# Patient Record
Sex: Female | Born: 1970 | Race: White | Hispanic: No | Marital: Married | Smoking: Never smoker
Health system: Southern US, Community
[De-identification: ages and names within clinical notes are randomized; demographics above are authoritative.]

## PROBLEM LIST (undated history)

## (undated) DIAGNOSIS — I10 Essential (primary) hypertension: Secondary | ICD-10-CM

## (undated) DIAGNOSIS — F32A Depression, unspecified: Secondary | ICD-10-CM

## (undated) DIAGNOSIS — M1990 Unspecified osteoarthritis, unspecified site: Secondary | ICD-10-CM

## (undated) DIAGNOSIS — C801 Malignant (primary) neoplasm, unspecified: Secondary | ICD-10-CM

## (undated) DIAGNOSIS — E079 Disorder of thyroid, unspecified: Secondary | ICD-10-CM

## (undated) HISTORY — DX: Depression, unspecified: F32.A

## (undated) HISTORY — DX: Essential (primary) hypertension: I10

## (undated) HISTORY — DX: Disorder of thyroid, unspecified: E07.9

## (undated) HISTORY — DX: Malignant (primary) neoplasm, unspecified: C80.1

## (undated) HISTORY — DX: Unspecified osteoarthritis, unspecified site: M19.90

---

## 2021-06-30 DIAGNOSIS — F5101 Primary insomnia: Secondary | ICD-10-CM | POA: Diagnosis not present

## 2021-06-30 DIAGNOSIS — I10 Essential (primary) hypertension: Secondary | ICD-10-CM | POA: Diagnosis not present

## 2021-06-30 DIAGNOSIS — E669 Obesity, unspecified: Secondary | ICD-10-CM | POA: Diagnosis not present

## 2021-06-30 DIAGNOSIS — E039 Hypothyroidism, unspecified: Secondary | ICD-10-CM | POA: Diagnosis not present

## 2021-06-30 DIAGNOSIS — Z833 Family history of diabetes mellitus: Secondary | ICD-10-CM | POA: Diagnosis not present

## 2021-06-30 DIAGNOSIS — E7849 Other hyperlipidemia: Secondary | ICD-10-CM | POA: Diagnosis not present

## 2021-06-30 DIAGNOSIS — F418 Other specified anxiety disorders: Secondary | ICD-10-CM | POA: Diagnosis not present

## 2021-07-23 DIAGNOSIS — J029 Acute pharyngitis, unspecified: Secondary | ICD-10-CM | POA: Diagnosis not present

## 2021-07-23 DIAGNOSIS — R109 Unspecified abdominal pain: Secondary | ICD-10-CM | POA: Diagnosis not present

## 2021-07-23 DIAGNOSIS — Z20828 Contact with and (suspected) exposure to other viral communicable diseases: Secondary | ICD-10-CM | POA: Diagnosis not present

## 2021-10-16 DIAGNOSIS — F418 Other specified anxiety disorders: Secondary | ICD-10-CM | POA: Diagnosis not present

## 2021-10-16 DIAGNOSIS — E039 Hypothyroidism, unspecified: Secondary | ICD-10-CM | POA: Diagnosis not present

## 2021-10-16 DIAGNOSIS — I10 Essential (primary) hypertension: Secondary | ICD-10-CM | POA: Diagnosis not present

## 2021-10-16 DIAGNOSIS — N809 Endometriosis, unspecified: Secondary | ICD-10-CM | POA: Diagnosis not present

## 2022-01-24 DIAGNOSIS — Z1389 Encounter for screening for other disorder: Secondary | ICD-10-CM | POA: Diagnosis not present

## 2022-01-24 DIAGNOSIS — G4700 Insomnia, unspecified: Secondary | ICD-10-CM | POA: Diagnosis not present

## 2022-01-24 DIAGNOSIS — Z01419 Encounter for gynecological examination (general) (routine) without abnormal findings: Secondary | ICD-10-CM | POA: Diagnosis not present

## 2022-01-24 DIAGNOSIS — Z130 Encounter for screening for diseases of the blood and blood-forming organs and certain disorders involving the immune mechanism: Secondary | ICD-10-CM | POA: Diagnosis not present

## 2022-02-14 DIAGNOSIS — Z1231 Encounter for screening mammogram for malignant neoplasm of breast: Secondary | ICD-10-CM | POA: Diagnosis not present

## 2022-02-14 DIAGNOSIS — R102 Pelvic and perineal pain: Secondary | ICD-10-CM | POA: Diagnosis not present

## 2022-03-19 ENCOUNTER — Ambulatory Visit (INDEPENDENT_AMBULATORY_CARE_PROVIDER_SITE_OTHER): Payer: BC Managed Care – PPO

## 2022-03-19 ENCOUNTER — Other Ambulatory Visit: Payer: Self-pay

## 2022-03-19 ENCOUNTER — Ambulatory Visit (INDEPENDENT_AMBULATORY_CARE_PROVIDER_SITE_OTHER): Payer: BC Managed Care – PPO | Admitting: Internal Medicine

## 2022-03-19 ENCOUNTER — Encounter: Payer: Self-pay | Admitting: Internal Medicine

## 2022-03-19 VITALS — BP 156/96 | HR 95 | Temp 99.3°F | Ht 64.0 in | Wt 190.0 lb

## 2022-03-19 DIAGNOSIS — G8929 Other chronic pain: Secondary | ICD-10-CM | POA: Diagnosis not present

## 2022-03-19 DIAGNOSIS — K5904 Chronic idiopathic constipation: Secondary | ICD-10-CM | POA: Insufficient documentation

## 2022-03-19 DIAGNOSIS — E039 Hypothyroidism, unspecified: Secondary | ICD-10-CM

## 2022-03-19 DIAGNOSIS — M545 Low back pain, unspecified: Secondary | ICD-10-CM | POA: Diagnosis not present

## 2022-03-19 DIAGNOSIS — E785 Hyperlipidemia, unspecified: Secondary | ICD-10-CM

## 2022-03-19 DIAGNOSIS — F332 Major depressive disorder, recurrent severe without psychotic features: Secondary | ICD-10-CM

## 2022-03-19 DIAGNOSIS — F5104 Psychophysiologic insomnia: Secondary | ICD-10-CM

## 2022-03-19 DIAGNOSIS — I10 Essential (primary) hypertension: Secondary | ICD-10-CM | POA: Diagnosis not present

## 2022-03-19 LAB — URINALYSIS, ROUTINE W REFLEX MICROSCOPIC
Bilirubin Urine: NEGATIVE
Ketones, ur: NEGATIVE
Nitrite: NEGATIVE
Specific Gravity, Urine: >=1.03 — AB (ref 1.000–1.030)
Total Protein, Urine: NEGATIVE
Urine Glucose: NEGATIVE
Urobilinogen, UA: 0.2 (ref 0.0–1.0)
pH: 5.5 (ref 5.0–8.0)

## 2022-03-19 LAB — CBC WITH DIFFERENTIAL/PLATELET
Basophils Absolute: 0.1 10*3/uL (ref 0.0–0.1)
Basophils Relative: 0.7 % (ref 0.0–3.0)
Eosinophils Absolute: 0.1 10*3/uL (ref 0.0–0.7)
Eosinophils Relative: 1.5 % (ref 0.0–5.0)
HCT: 39.5 % (ref 36.0–46.0)
Hemoglobin: 13.5 g/dL (ref 12.0–15.0)
Lymphocytes Relative: 23 % (ref 12.0–46.0)
Lymphs Abs: 2.1 10*3/uL (ref 0.7–4.0)
MCHC: 34.1 g/dL (ref 30.0–36.0)
MCV: 89.5 fl (ref 78.0–100.0)
Monocytes Absolute: 0.5 10*3/uL (ref 0.1–1.0)
Monocytes Relative: 5.9 % (ref 3.0–12.0)
Neutro Abs: 6.3 10*3/uL (ref 1.4–7.7)
Neutrophils Relative %: 68.9 % (ref 43.0–77.0)
Platelets: 349 10*3/uL (ref 150.0–400.0)
RBC: 4.42 Mil/uL (ref 3.87–5.11)
RDW: 13.6 % (ref 11.5–15.5)
WBC: 9.1 10*3/uL (ref 4.0–10.5)

## 2022-03-19 LAB — TSH: TSH: 0.91 u[IU]/mL (ref 0.35–5.50)

## 2022-03-19 NOTE — Patient Instructions (Signed)

## 2022-03-19 NOTE — Progress Notes (Signed)
? ?Subjective:  ?Patient ID: Jennifer Jensen, female    DOB: 12-21-1970  Age: 52 y.o. MRN: [id number] ? ?CC: Hypertension, Osteoarthritis, and Back Pain ? ?This visit occurred during the SARS-CoV-2 public health emergency.  Safety protocols were in place, including screening questions prior to the visit, additional usage of staff PPE, and extensive cleaning of exam room while observing appropriate contact time as indicated for disinfecting solutions.   ? ?HPI ?Jennifer Jensen presents for establishing. ? ?She complains of a several month hx of DOE but denies CP, edema, diaphoresis, or palpitations. Her M died 3 months ago and she has had worsening s/s of depression since then but denies feeling worthless, helpless, hopeless, or suicidal.  She is not getting much symptom relief with her current doses of Lexapro and Ambien. ? ?History ?Jennifer Jensen has a past medical history of Arthritis, Cancer ([place]), Depression, Hypertension, and Thyroid disease.  ? ?She has no past surgical history on file.  ? ?Her family history includes COPD in her father; Cancer in her mother; Depression in her brother; Diabetes in her brother and mother; Heart attack in her father; Hyperlipidemia in her brother and sister; Hypertension in her brother, mother, and sister.She reports that she has never smoked. She has been exposed to tobacco smoke. She has never used smokeless tobacco. She reports current alcohol use of about 1.0 standard drink per week. She reports that she does not use drugs. ? ?Outpatient Medications Prior to Visit  ?Medication Sig Dispense Refill  ? ASHLYNA 0.15-0.03 &0.01 MG tablet Take 1 tablet by mouth daily.    ? levothyroxine (SYNTHROID) 50 MCG tablet Take 50 mcg by mouth daily.    ? lisinopril (ZESTRIL) 10 MG tablet Take 10 mg by mouth daily.    ? Multiple Vitamins-Minerals (WOMENS MULTI) CAPS See admin instructions.    ? escitalopram (LEXAPRO) 10 MG tablet 1 tablet    ? ?No facility-administered medications prior to visit.   ? ? ?ROS ?Review of Systems  ?Constitutional:  Positive for unexpected weight change (wt gain). Negative for appetite change, chills, diaphoresis and fatigue.  ?Respiratory:  Positive for apnea. Negative for shortness of breath and wheezing.   ?Cardiovascular:  Negative for chest pain, palpitations and leg swelling.  ?Gastrointestinal:  Positive for constipation. Negative for abdominal pain, blood in stool, diarrhea and vomiting.  ?Genitourinary: Negative.  Negative for difficulty urinating, dysuria and hematuria.  ?Musculoskeletal:  Positive for arthralgias (hips and knees) and back pain. Negative for gait problem and myalgias.  ?Skin: Negative.  Negative for color change.  ?Neurological: Negative.  Negative for dizziness, weakness and headaches.  ?Hematological: Negative.   ?Psychiatric/Behavioral:  Positive for dysphoric mood and sleep disturbance. Negative for agitation, behavioral problems, confusion, decreased concentration, hallucinations, self-injury and suicidal ideas. The patient is not nervous/anxious and is not hyperactive.   ?     Crying spells  ? ?Objective:  ?BP (!) 156/96   Pulse 95   Temp 99.3 ?F (37.4 ?C) (Oral)   Ht '5\' 4"'$  (1.626 m)   Wt 190 lb (86.2 kg)   LMP 12/14/2021 (Approximate)   SpO2 98%   BMI 32.61 kg/m?  ? ?Physical Exam ?Cardiovascular:  ?   Rate and Rhythm: Normal rate and regular rhythm.  ?   Heart sounds: Normal heart sounds, S1 normal and S2 normal. No murmur heard. ?  No friction rub. No gallop.  ?   Comments: EKG- ?NSR, 80 bpm ??LAE ?No LVH or Q waves ?Musculoskeletal:  ?  Right lower leg: No edema.  ?   Left lower leg: No edema.  ?Psychiatric:     ?   Attention and Perception: She is inattentive.     ?   Mood and Affect: Mood is anxious and depressed. Affect is tearful.     ?   Speech: She is communicative. Speech is tangential. Speech is not rapid and pressured or delayed.     ?   Behavior: Behavior normal. Behavior is not agitated, slowed, aggressive, withdrawn or  hyperactive. Behavior is cooperative.     ?   Thought Content: Thought content normal. Thought content is not paranoid or delusional. Thought content does not include homicidal or suicidal ideation.     ?   Cognition and Memory: Cognition normal.     ?   Judgment: Judgment normal.  ? ? ?Lab Results  ?Component Value Date  ? WBC 9.1 03/19/2022  ? HGB 13.5 03/19/2022  ? HCT 39.5 03/19/2022  ? PLT 349.0 03/19/2022  ? GLUCOSE 90 03/19/2022  ? CHOL 201 (H) 03/19/2022  ? TRIG 214.0 (H) 03/19/2022  ? HDL 48.40 03/19/2022  ? LDLDIRECT 149.0 03/19/2022  ? ALT 15 03/19/2022  ? AST 18 03/19/2022  ? NA 138 03/19/2022  ? K 4.9 03/19/2022  ? CL 107 03/19/2022  ? CREATININE 0.80 03/19/2022  ? BUN 13 03/19/2022  ? CO2 21 03/19/2022  ? TSH 0.91 03/19/2022  ?  ?DG Lumbar Spine Complete ? ?Result Date: 03/22/2022 ?CLINICAL DATA:  Low back pain for 2-3 months. EXAM: LUMBAR SPINE - COMPLETE 4+ VIEW COMPARISON:  None. FINDINGS: No fracture, bone lesion or spondylolisthesis. There are transitional thoracolumbar and lumbosacral vertebra. Lowest vertebra consistent with lumbarization of S1, designated S1. Mild loss of disc height at L4-L5 and L5-S1. Mild facet degenerative changes at L5-S1. Soft tissues are unremarkable. IMPRESSION: 1. No fracture or acute finding. 2. Mild lower lumbar spine disc and facet degenerative changes as detailed. Electronically Signed   By: Lajean Manes M.D.   On: 03/22/2022 10:10    ? ?Assessment & Plan:  ? ?Jennifer Jensen was seen today for hypertension, osteoarthritis and back pain. ? ?Diagnoses and all orders for this visit: ? ?Hyperlipidemia LDL goal <130- Statin therapy is not indicated. ?-     Lipid panel; Future ?-     TSH; Future ?-     Hepatic function panel; Future ?-     Hepatic function panel ?-     TSH ?-     Lipid panel ? ?Chronic idiopathic constipation- Labs are negative for secondary causes. ?-     Basic metabolic panel; Future ?-     Basic metabolic panel ? ?Chronic bilateral low back pain without  sciatica- Plain films are remarkable for degenerative disc disease. ?-     DG Lumbar Spine Complete; Future ? ?Primary hypertension- Her EKG is normal.  Will check labs to screen for secondary causes and endorgan damage.  Will treat with indapamide. ?-     Basic metabolic panel; Future ?-     Urinalysis, Routine w reflex microscopic; Future ?-     Hepatic function panel; Future ?-     CBC with Differential/Platelet; Future ?-     Aldosterone + renin activity w/ ratio; Future ?-     EKG 12-Lead ?-     Aldosterone + renin activity w/ ratio ?-     CBC with Differential/Platelet ?-     Hepatic function panel ?-     Urinalysis, Routine w  reflex microscopic ?-     Basic metabolic panel ?-     indapamide (LOZOL) 1.25 MG tablet; Take 1 tablet (1.25 mg total) by mouth daily. ? ?Acquired hypothyroidism- Her TSH is in the normal range.  She will stay on the current T4 dosage. ? ?Severe episode of recurrent major depressive disorder, without psychotic features ([place]) ?-     Vilazodone HCl (VIIBRYD) 10 MG TABS; Take 1 tablet (10 mg total) by mouth daily. ? ?Psychophysiological insomnia ?-     Suvorexant (BELSOMRA) 15 MG TABS; Take 15 mg by mouth at bedtime as needed. ? ?Other orders ?-     LDL cholesterol, direct ? ? ?I have discontinued Jennifer Jensen. Schildt's escitalopram. I am also having her start on indapamide, Vilazodone HCl, and Belsomra. Additionally, I am having her maintain her Ashlyna, levothyroxine, lisinopril, and [employer]. ? ?Meds ordered this encounter  ?Medications  ? indapamide (LOZOL) 1.25 MG tablet  ?  Sig: Take 1 tablet (1.25 mg total) by mouth daily.  ?  Dispense:  90 tablet  ?  Refill:  0  ? Vilazodone HCl (VIIBRYD) 10 MG TABS  ?  Sig: Take 1 tablet (10 mg total) by mouth daily.  ?  Dispense:  7 tablet  ?  Refill:  0  ? Suvorexant (BELSOMRA) 15 MG TABS  ?  Sig: Take 15 mg by mouth at bedtime as needed.  ?  Dispense:  90 tablet  ?  Refill:  0  ? ? ? ?Follow-up: Return in about 6 weeks (around 04/30/2022). ? ?Scarlette Calico, MD ?

## 2022-03-20 ENCOUNTER — Encounter: Payer: Self-pay | Admitting: Internal Medicine

## 2022-03-20 DIAGNOSIS — F5104 Psychophysiologic insomnia: Secondary | ICD-10-CM | POA: Insufficient documentation

## 2022-03-20 DIAGNOSIS — F332 Major depressive disorder, recurrent severe without psychotic features: Secondary | ICD-10-CM | POA: Insufficient documentation

## 2022-03-20 LAB — BASIC METABOLIC PANEL
BUN: 13 mg/dL (ref 6–23)
CO2: 21 mEq/L (ref 19–32)
Calcium: 9.2 mg/dL (ref 8.4–10.5)
Chloride: 107 mEq/L (ref 96–112)
Creatinine, Ser: 0.8 mg/dL (ref 0.40–1.20)
GFR: 86.16 mL/min (ref >60.00)
Glucose, Bld: 90 mg/dL (ref 70–99)
Potassium: 4.9 mEq/L (ref 3.5–5.1)
Sodium: 138 mEq/L (ref 135–145)

## 2022-03-20 LAB — LIPID PANEL
Cholesterol: 201 mg/dL — ABNORMAL HIGH (ref 0–200)
HDL: 48.4 mg/dL (ref >39.00)
NonHDL: 152.8
Total CHOL/HDL Ratio: 4
Triglycerides: 214 mg/dL — ABNORMAL HIGH (ref 0.0–149.0)
VLDL: 42.8 mg/dL — ABNORMAL HIGH (ref 0.0–40.0)

## 2022-03-20 LAB — HEPATIC FUNCTION PANEL
ALT: 15 U/L (ref 0–35)
AST: 18 U/L (ref 0–37)
Albumin: 4.1 g/dL (ref 3.5–5.2)
Alkaline Phosphatase: 54 U/L (ref 39–117)
Bilirubin, Direct: 0 mg/dL (ref 0.0–0.3)
Total Bilirubin: 0.2 mg/dL (ref 0.2–1.2)
Total Protein: 7 g/dL (ref 6.0–8.3)

## 2022-03-20 LAB — LDL CHOLESTEROL, DIRECT: Direct LDL: 149 mg/dL

## 2022-03-20 MED ORDER — BELSOMRA 15 MG PO TABS: 15.0000 mg | ORAL_TABLET | Freq: Every evening | ORAL | 0 refills | Status: DC | PRN

## 2022-03-20 MED ORDER — INDAPAMIDE 1.25 MG PO TABS: 1.2500 mg | ORAL_TABLET | Freq: Every day | ORAL | 0 refills | Status: DC

## 2022-03-20 MED ORDER — VILAZODONE HCL 10 MG PO TABS: 10.0000 mg | ORAL_TABLET | Freq: Every day | ORAL | 0 refills | Status: DC

## 2022-03-23 ENCOUNTER — Encounter: Payer: Self-pay | Admitting: Internal Medicine

## 2022-03-23 ENCOUNTER — Other Ambulatory Visit: Payer: Self-pay | Admitting: Internal Medicine

## 2022-03-23 DIAGNOSIS — M545 Low back pain, unspecified: Secondary | ICD-10-CM

## 2022-03-23 MED ORDER — CELECOXIB 50 MG PO CAPS: 50.0000 mg | ORAL_CAPSULE | Freq: Two times a day (BID) | ORAL | 0 refills | Status: DC

## 2022-03-24 ENCOUNTER — Other Ambulatory Visit: Payer: Self-pay | Admitting: Internal Medicine

## 2022-03-24 ENCOUNTER — Encounter: Payer: Self-pay | Admitting: Internal Medicine

## 2022-03-24 DIAGNOSIS — G8929 Other chronic pain: Secondary | ICD-10-CM

## 2022-03-24 MED ORDER — CELECOXIB 100 MG PO CAPS: 100.0000 mg | ORAL_CAPSULE | Freq: Every day | ORAL | 1 refills | Status: AC

## 2022-03-26 ENCOUNTER — Other Ambulatory Visit: Payer: Self-pay | Admitting: Internal Medicine

## 2022-03-26 DIAGNOSIS — M545 Low back pain, unspecified: Secondary | ICD-10-CM

## 2022-03-26 DIAGNOSIS — F5104 Psychophysiologic insomnia: Secondary | ICD-10-CM

## 2022-03-26 LAB — ALDOSTERONE + RENIN ACTIVITY W/ RATIO
ALDO / PRA Ratio: 1 Ratio (ref 0.9–28.9)
Aldosterone: 5 ng/dL
Renin Activity: 5.12 ng/mL/h (ref 0.25–5.82)

## 2022-03-26 MED ORDER — ZOLPIDEM TARTRATE ER 12.5 MG PO TBCR: 12.5000 mg | EXTENDED_RELEASE_TABLET | Freq: Every evening | ORAL | 0 refills | Status: AC | PRN

## 2022-03-28 ENCOUNTER — Other Ambulatory Visit: Payer: Self-pay | Admitting: Internal Medicine

## 2022-03-28 DIAGNOSIS — I10 Essential (primary) hypertension: Secondary | ICD-10-CM

## 2022-03-28 DIAGNOSIS — F332 Major depressive disorder, recurrent severe without psychotic features: Secondary | ICD-10-CM

## 2022-03-28 DIAGNOSIS — Z3041 Encounter for surveillance of contraceptive pills: Secondary | ICD-10-CM

## 2022-03-28 MED ORDER — ASHLYNA 0.15-0.03 &0.01 MG PO TABS: 1.0000 | ORAL_TABLET | Freq: Every day | ORAL | 1 refills | Status: AC

## 2022-03-28 MED ORDER — OLMESARTAN MEDOXOMIL 20 MG PO TABS: 20.0000 mg | ORAL_TABLET | Freq: Every day | ORAL | 1 refills | Status: AC

## 2022-03-28 MED ORDER — VILAZODONE HCL 20 MG PO TABS: 1.0000 | ORAL_TABLET | Freq: Every day | ORAL | 0 refills | Status: DC

## 2022-04-07 ENCOUNTER — Encounter: Payer: Self-pay | Admitting: Internal Medicine

## 2022-04-09 ENCOUNTER — Other Ambulatory Visit: Payer: Self-pay | Admitting: Internal Medicine

## 2022-04-09 DIAGNOSIS — F332 Major depressive disorder, recurrent severe without psychotic features: Secondary | ICD-10-CM

## 2022-04-09 MED ORDER — ESCITALOPRAM OXALATE 20 MG PO TABS: 20.0000 mg | ORAL_TABLET | Freq: Every day | ORAL | 1 refills | Status: DC

## 2022-04-25 DIAGNOSIS — L600 Ingrowing nail: Secondary | ICD-10-CM | POA: Diagnosis not present

## 2022-04-25 DIAGNOSIS — M79672 Pain in left foot: Secondary | ICD-10-CM | POA: Diagnosis not present

## 2022-04-25 DIAGNOSIS — M79671 Pain in right foot: Secondary | ICD-10-CM | POA: Diagnosis not present

## 2022-04-30 DIAGNOSIS — L600 Ingrowing nail: Secondary | ICD-10-CM | POA: Diagnosis not present

## 2022-04-30 DIAGNOSIS — M79672 Pain in left foot: Secondary | ICD-10-CM | POA: Diagnosis not present

## 2022-04-30 DIAGNOSIS — M79671 Pain in right foot: Secondary | ICD-10-CM | POA: Diagnosis not present

## 2022-05-01 ENCOUNTER — Ambulatory Visit: Payer: BC Managed Care – PPO | Admitting: Internal Medicine

## 2022-05-15 DIAGNOSIS — Z3041 Encounter for surveillance of contraceptive pills: Secondary | ICD-10-CM | POA: Diagnosis not present

## 2022-05-15 DIAGNOSIS — Z8742 Personal history of other diseases of the female genital tract: Secondary | ICD-10-CM | POA: Diagnosis not present

## 2022-05-15 DIAGNOSIS — M545 Low back pain, unspecified: Secondary | ICD-10-CM | POA: Diagnosis not present

## 2022-06-13 DIAGNOSIS — F339 Major depressive disorder, recurrent, unspecified: Secondary | ICD-10-CM | POA: Diagnosis not present

## 2022-06-13 DIAGNOSIS — G4733 Obstructive sleep apnea (adult) (pediatric): Secondary | ICD-10-CM | POA: Diagnosis not present

## 2022-06-13 DIAGNOSIS — Z85831 Personal history of malignant neoplasm of soft tissue: Secondary | ICD-10-CM | POA: Diagnosis not present

## 2022-06-13 DIAGNOSIS — Z1211 Encounter for screening for malignant neoplasm of colon: Secondary | ICD-10-CM | POA: Diagnosis not present

## 2022-06-13 DIAGNOSIS — K6389 Other specified diseases of intestine: Secondary | ICD-10-CM | POA: Diagnosis not present

## 2022-06-13 DIAGNOSIS — E785 Hyperlipidemia, unspecified: Secondary | ICD-10-CM | POA: Diagnosis not present

## 2022-06-13 DIAGNOSIS — M1990 Unspecified osteoarthritis, unspecified site: Secondary | ICD-10-CM | POA: Diagnosis not present

## 2022-06-13 DIAGNOSIS — Z8371 Family history of colonic polyps: Secondary | ICD-10-CM | POA: Diagnosis not present

## 2022-06-13 DIAGNOSIS — Q439 Congenital malformation of intestine, unspecified: Secondary | ICD-10-CM | POA: Diagnosis not present

## 2022-06-13 DIAGNOSIS — E039 Hypothyroidism, unspecified: Secondary | ICD-10-CM | POA: Diagnosis not present

## 2022-06-13 DIAGNOSIS — Z79899 Other long term (current) drug therapy: Secondary | ICD-10-CM | POA: Diagnosis not present

## 2022-06-13 DIAGNOSIS — K529 Noninfective gastroenteritis and colitis, unspecified: Secondary | ICD-10-CM | POA: Diagnosis not present

## 2022-06-13 DIAGNOSIS — Z880 Allergy status to penicillin: Secondary | ICD-10-CM | POA: Diagnosis not present

## 2022-06-13 DIAGNOSIS — Z6830 Body mass index (BMI) 30.0-30.9, adult: Secondary | ICD-10-CM | POA: Diagnosis not present

## 2022-06-13 DIAGNOSIS — I10 Essential (primary) hypertension: Secondary | ICD-10-CM | POA: Diagnosis not present

## 2022-06-13 DIAGNOSIS — Z87891 Personal history of nicotine dependence: Secondary | ICD-10-CM | POA: Diagnosis not present

## 2022-06-13 DIAGNOSIS — Z791 Long term (current) use of non-steroidal anti-inflammatories (NSAID): Secondary | ICD-10-CM | POA: Diagnosis not present

## 2022-06-27 DIAGNOSIS — Z131 Encounter for screening for diabetes mellitus: Secondary | ICD-10-CM | POA: Diagnosis not present

## 2022-06-27 DIAGNOSIS — Z23 Encounter for immunization: Secondary | ICD-10-CM | POA: Diagnosis not present

## 2022-06-27 DIAGNOSIS — I10 Essential (primary) hypertension: Secondary | ICD-10-CM | POA: Diagnosis not present

## 2022-06-27 DIAGNOSIS — E039 Hypothyroidism, unspecified: Secondary | ICD-10-CM | POA: Diagnosis not present

## 2022-06-27 DIAGNOSIS — F325 Major depressive disorder, single episode, in full remission: Secondary | ICD-10-CM | POA: Diagnosis not present

## 2022-06-27 DIAGNOSIS — E785 Hyperlipidemia, unspecified: Secondary | ICD-10-CM | POA: Diagnosis not present

## 2022-06-27 DIAGNOSIS — F5104 Psychophysiologic insomnia: Secondary | ICD-10-CM | POA: Diagnosis not present

## 2022-07-12 DIAGNOSIS — L03032 Cellulitis of left toe: Secondary | ICD-10-CM | POA: Diagnosis not present

## 2022-07-12 DIAGNOSIS — M79672 Pain in left foot: Secondary | ICD-10-CM | POA: Diagnosis not present

## 2022-08-30 DIAGNOSIS — I10 Essential (primary) hypertension: Secondary | ICD-10-CM | POA: Diagnosis not present

## 2022-08-30 DIAGNOSIS — L600 Ingrowing nail: Secondary | ICD-10-CM | POA: Diagnosis not present

## 2022-08-30 DIAGNOSIS — M79674 Pain in right toe(s): Secondary | ICD-10-CM | POA: Diagnosis not present

## 2022-08-30 DIAGNOSIS — M79675 Pain in left toe(s): Secondary | ICD-10-CM | POA: Diagnosis not present

## 2022-10-17 DIAGNOSIS — B9689 Other specified bacterial agents as the cause of diseases classified elsewhere: Secondary | ICD-10-CM | POA: Diagnosis not present

## 2022-10-17 DIAGNOSIS — J019 Acute sinusitis, unspecified: Secondary | ICD-10-CM | POA: Diagnosis not present

## 2022-10-17 DIAGNOSIS — I10 Essential (primary) hypertension: Secondary | ICD-10-CM | POA: Diagnosis not present

## 2022-11-10 ENCOUNTER — Other Ambulatory Visit: Payer: Self-pay | Admitting: Internal Medicine

## 2022-11-10 DIAGNOSIS — F332 Major depressive disorder, recurrent severe without psychotic features: Secondary | ICD-10-CM

## 2022-11-13 DIAGNOSIS — H66002 Acute suppurative otitis media without spontaneous rupture of ear drum, left ear: Secondary | ICD-10-CM | POA: Diagnosis not present

## 2022-11-13 DIAGNOSIS — I10 Essential (primary) hypertension: Secondary | ICD-10-CM | POA: Diagnosis not present

## 2022-11-13 DIAGNOSIS — F332 Major depressive disorder, recurrent severe without psychotic features: Secondary | ICD-10-CM | POA: Diagnosis not present

## 2022-11-13 DIAGNOSIS — J0141 Acute recurrent pansinusitis: Secondary | ICD-10-CM | POA: Diagnosis not present

## 2023-12-12 IMAGING — DX DG LUMBAR SPINE COMPLETE 4+V
5 series · 5 of 5 positions shown · non-contrast
Comparison: None.

CLINICAL DATA: Low back pain for 2-3 months.

EXAM:
LUMBAR SPINE - COMPLETE 4+ VIEW

[l-spine ap]
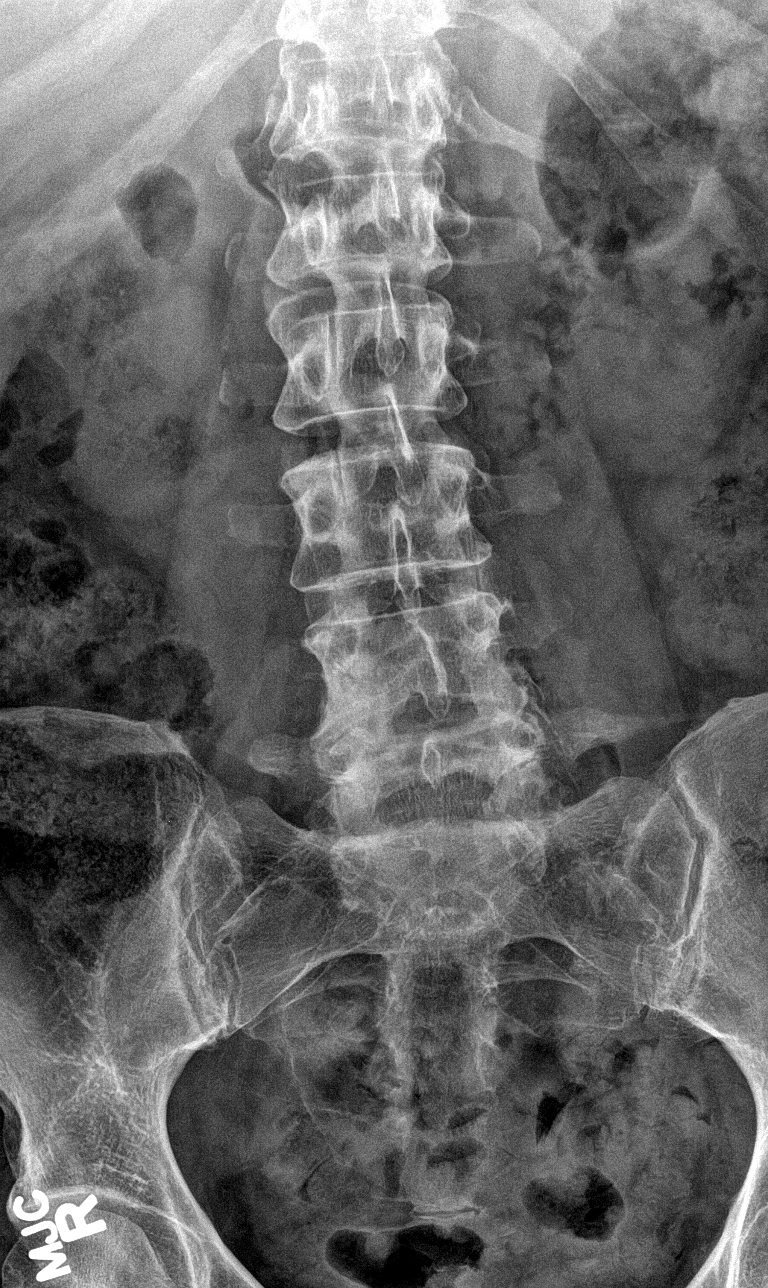

[l-spine obl (1 of 2)]
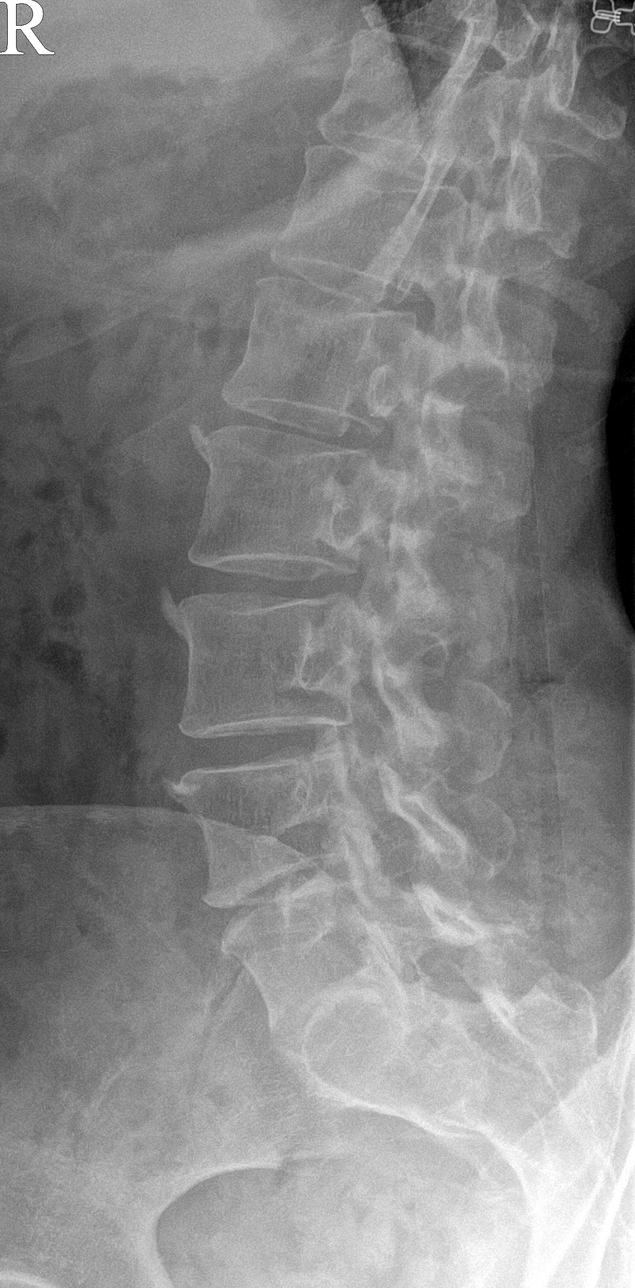

[l-spine obl (2 of 2)]
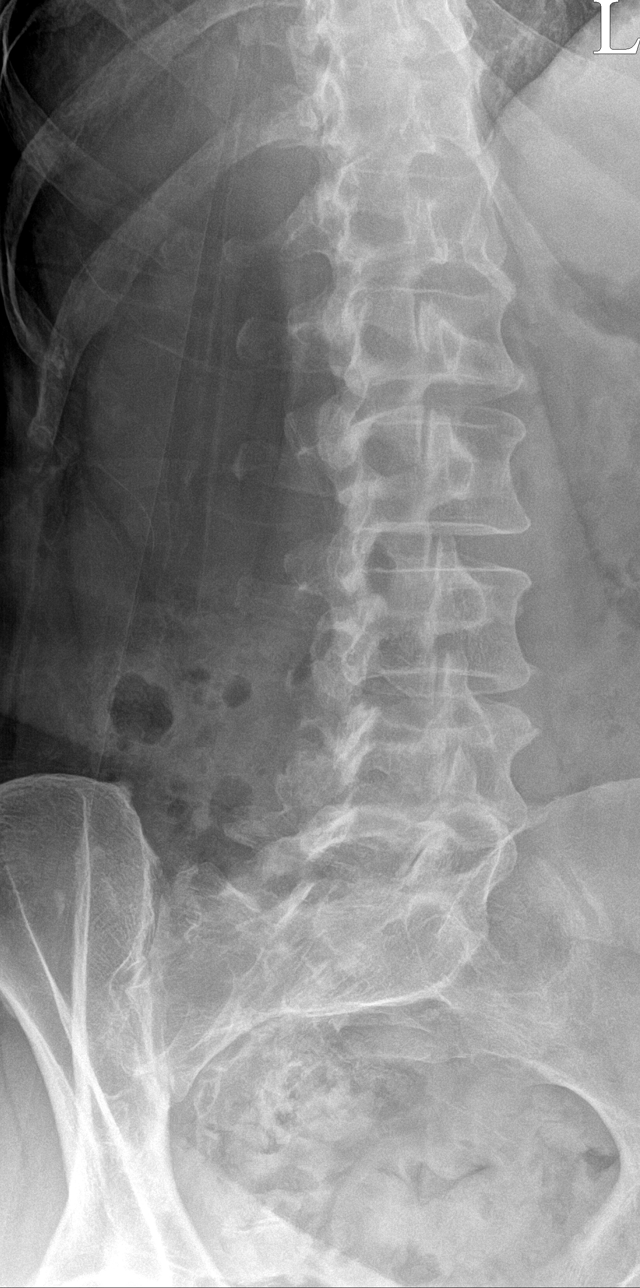

[l-spine lateral]
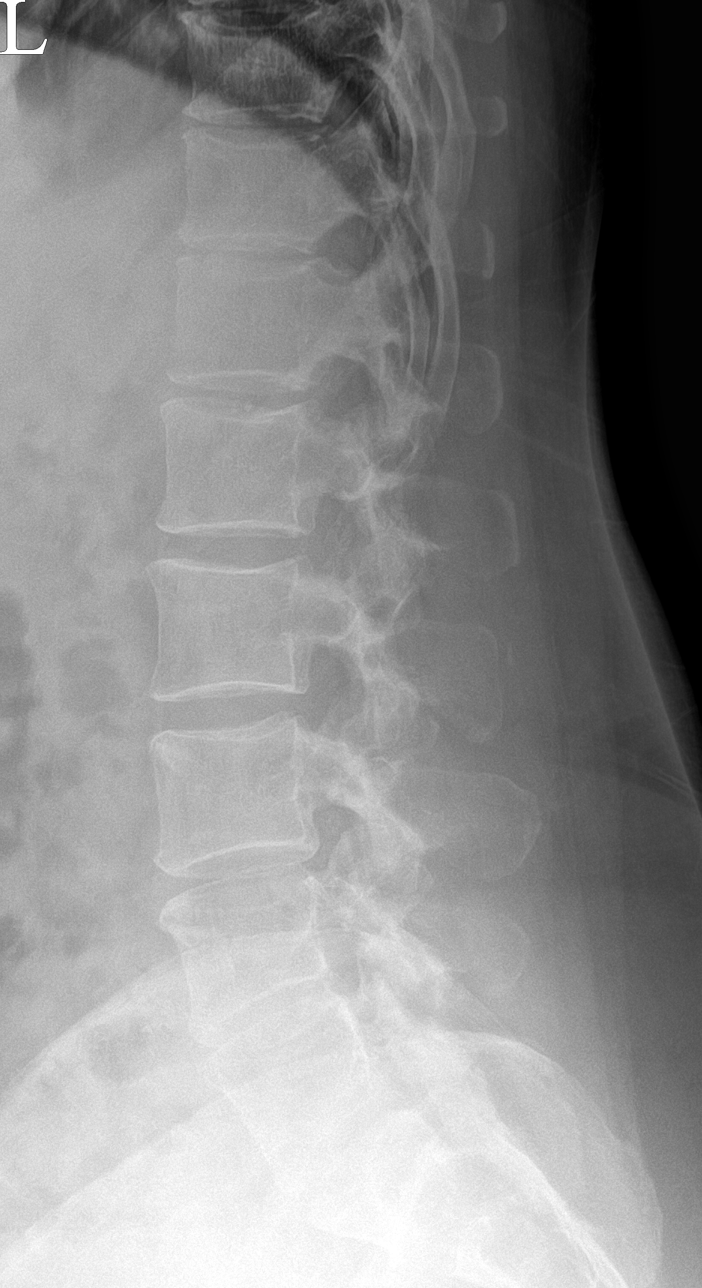

[l-spine spot]
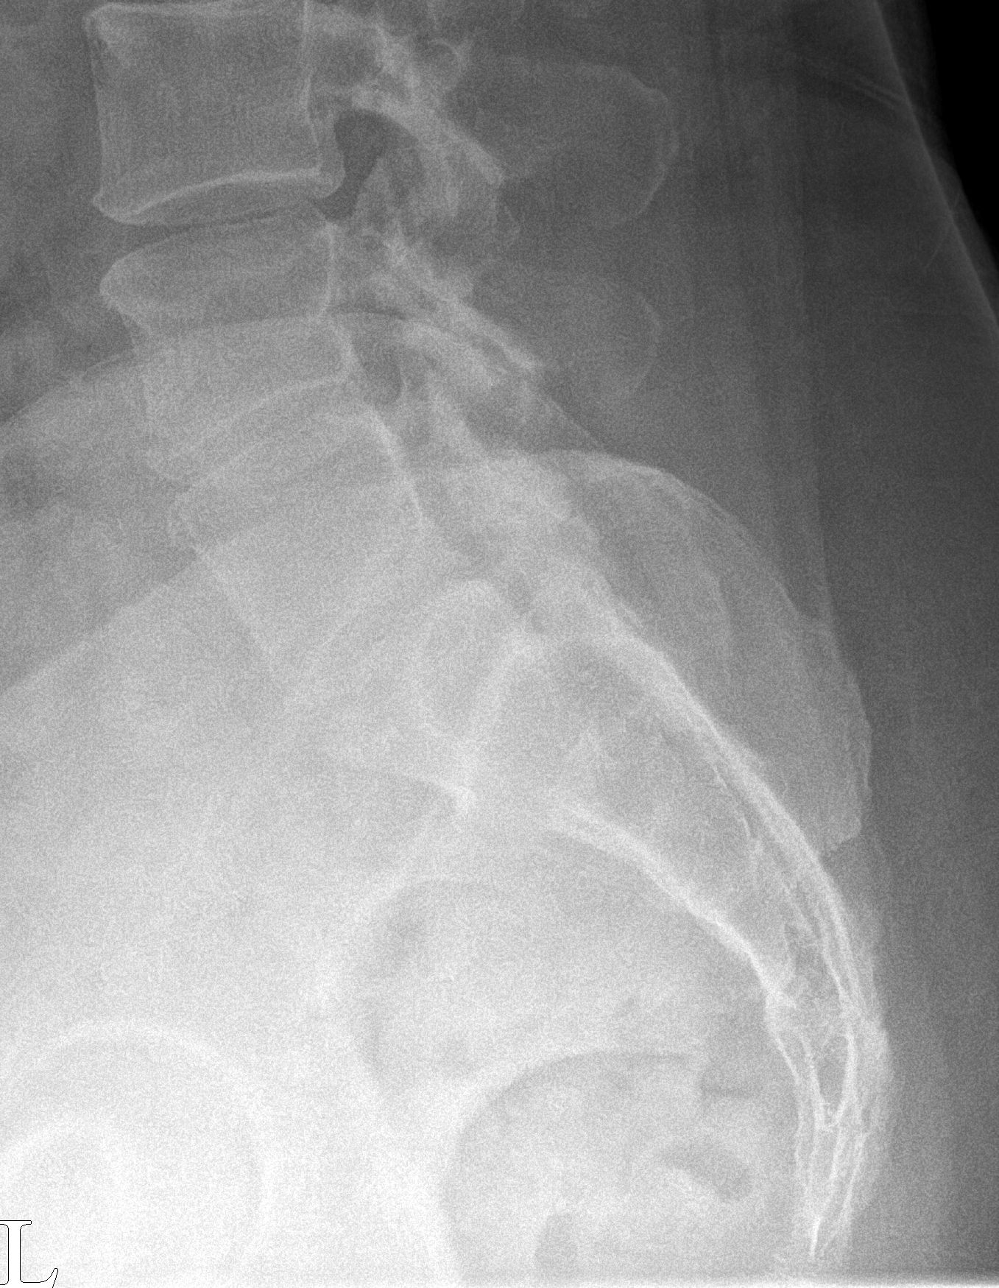

[5 of 5 positions shown; findings below may reference images not displayed]

FINDINGS: No fracture, bone lesion or spondylolisthesis.

There are transitional thoracolumbar and lumbosacral vertebra.
Lowest vertebra consistent with lumbarization of S1, designated S1.

Mild loss of disc height at L4-L5 and L5-S1. Mild facet degenerative
changes at L5-S1.

Soft tissues are unremarkable.
IMPRESSION: 1. No fracture or acute finding.
2. Mild lower lumbar spine disc and facet degenerative changes as
detailed.
